# Patient Record
Sex: Male | Born: 1965 | Race: White | Hispanic: No | Marital: Single | Smoking: Never smoker
Health system: Southern US, Community
[De-identification: ages and names within clinical notes are randomized; demographics above are authoritative.]

## PROBLEM LIST (undated history)

## (undated) DIAGNOSIS — K219 Gastro-esophageal reflux disease without esophagitis: Secondary | ICD-10-CM

---

## 2000-08-01 ENCOUNTER — Emergency Department (HOSPITAL_COMMUNITY): Admission: EM | Admit: 2000-08-01 | Discharge: 2000-08-01 | Payer: Self-pay

## 2000-08-12 ENCOUNTER — Emergency Department (HOSPITAL_COMMUNITY): Admission: EM | Admit: 2000-08-12 | Discharge: 2000-08-12 | Payer: Self-pay | Admitting: Emergency Medicine

## 2010-12-28 ENCOUNTER — Ambulatory Visit (INDEPENDENT_AMBULATORY_CARE_PROVIDER_SITE_OTHER): Payer: BC Managed Care – PPO

## 2010-12-28 DIAGNOSIS — J40 Bronchitis, not specified as acute or chronic: Secondary | ICD-10-CM

## 2010-12-28 DIAGNOSIS — R05 Cough: Secondary | ICD-10-CM

## 2012-08-16 ENCOUNTER — Ambulatory Visit (INDEPENDENT_AMBULATORY_CARE_PROVIDER_SITE_OTHER): Payer: BC Managed Care – PPO | Admitting: Emergency Medicine

## 2012-08-16 ENCOUNTER — Ambulatory Visit: Payer: BC Managed Care – PPO

## 2012-08-16 VITALS — BP 122/82 | HR 54 | Temp 97.6°F | Resp 18 | Ht 69.0 in | Wt 182.4 lb

## 2012-08-16 DIAGNOSIS — M25512 Pain in left shoulder: Secondary | ICD-10-CM

## 2012-08-16 DIAGNOSIS — M25519 Pain in unspecified shoulder: Secondary | ICD-10-CM

## 2012-08-16 DIAGNOSIS — M67919 Unspecified disorder of synovium and tendon, unspecified shoulder: Secondary | ICD-10-CM

## 2012-08-16 DIAGNOSIS — M7552 Bursitis of left shoulder: Secondary | ICD-10-CM

## 2012-08-16 MED ORDER — METHYLPREDNISOLONE ACETATE 80 MG/ML IJ SUSP: 80.0000 mg | Freq: Once | INTRAMUSCULAR | Status: DC

## 2012-08-16 MED ORDER — NAPROXEN SODIUM 550 MG PO TABS: 550.0000 mg | ORAL_TABLET | Freq: Two times a day (BID) | ORAL | Status: AC

## 2012-08-16 NOTE — Progress Notes (Addendum)
[facility] [address], [place] [address] [phone]  Date:  08/16/2012   Name:  Paul Singh   DOB:  1965/07/24   MRN:  [id number]  PCP:  No primary provider on file.    Chief Complaint: Shoulder Pain   History of Present Illness:  Paul Singh is a 47 y.o. very pleasant male patient who presents with the following:  Shoulder pain in left shoulder for two weeks.  No history of injury or overuse.  Says that he has pain with laying on the left side.  Full active ROM.  Pain is usually constant but waxes and wanes.  No improvement with over the counter medications or other home remedies. Denies other complaint or health concern today.   There are no active problems to display for this patient.   History reviewed. No pertinent past medical history.  History reviewed. No pertinent past surgical history.  History  Substance Use Topics  . Smoking status: Never Smoker   . Smokeless tobacco: Not on file  . Alcohol Use: No    History reviewed. No pertinent family history.  No Known Allergies  Medication list has been reviewed and updated.  No current outpatient prescriptions on file prior to visit.   No current facility-administered medications on file prior to visit.    Review of Systems:  As per HPI, otherwise negative.    Physical Examination: Filed Vitals:   08/16/12 1150  BP: 122/82  Pulse: 54  Temp: 97.6 F (36.4 C)  Resp: 18   Filed Vitals:   08/16/12 1150  Height: 5\' 9"  (1.753 m)  Weight: 182 lb 6.4 oz (82.736 kg)   Body mass index is 26.92 kg/(m^2). Ideal Body Weight: Weight in (lb) to have BMI = 25: 168.9   GEN: WDWN, NAD, Non-toxic, Alert & Oriented x 3 HEENT: Atraumatic, Normocephalic.  Ears and Nose: No external deformity. EXTR: No clubbing/cyanosis/edema NEURO: Normal gait.  PSYCH: Normally interactive. Conversant. Not depressed or anxious appearing.  Calm demeanor.  SHOULDER:  Left is tender in anterior shoulder  with full passive and active ROM.    Assessment and Plan: Left shoulder bursitis Injected 80 depo and 1 ml marcaine    Signed,  Phillips Odor, MD   [facility] reading (PRIMARY) by  Dr. Dareen Piano.  Negative shoulder.

## 2012-08-16 NOTE — Patient Instructions (Addendum)
Bursitis Bursitis is a swelling and soreness (inflammation) of a fluid-filled sac (bursa) that overlies and protects a joint. It can be caused by injury, overuse of the joint, arthritis or infection. The joints most likely to be affected are the elbows, shoulders, hips and knees. HOME CARE INSTRUCTIONS   Apply ice to the affected area for 15-20 minutes each hour while awake for 2 days. Put the ice in a plastic bag and place a towel between the bag of ice and your skin.  Rest the injured joint as much as possible, but continue to put the joint through a full range of motion, 4 times per day. (The shoulder joint especially becomes rapidly "frozen" if not used.) When the pain lessens, begin normal slow movements and usual activities.  Only take over-the-counter or prescription medicines for pain, discomfort or fever as directed by your caregiver.  Your caregiver may recommend draining the bursa and injecting medicine into the bursa. This may help the healing process.  Follow all instructions for follow-up with your caregiver. This includes any orthopedic referrals, physical therapy and rehabilitation. Any delay in obtaining necessary care could result in a delay or failure of the bursitis to heal and chronic pain. SEEK IMMEDIATE MEDICAL CARE IF:   Your pain increases even during treatment.  You develop an oral temperature above 102 F (38.9 C) and have heat and inflammation over the involved bursa. MAKE SURE YOU:   Understand these instructions.  Will watch your condition.  Will get help right away if you are not doing well or get worse. Document Released: 12/26/1999 Document Revised: 03/22/2011 Document Reviewed: 11/29/2008 Exit[employer] Patient Information 2014 [employer].  

## 2016-09-29 ENCOUNTER — Ambulatory Visit (INDEPENDENT_AMBULATORY_CARE_PROVIDER_SITE_OTHER): Payer: BC Managed Care – PPO | Admitting: Physician Assistant

## 2016-09-29 ENCOUNTER — Encounter: Payer: Self-pay | Admitting: Physician Assistant

## 2016-09-29 VITALS — BP 117/82 | HR 111 | Temp 98.1°F | Resp 17 | Ht 69.0 in | Wt 234.0 lb

## 2016-09-29 DIAGNOSIS — R112 Nausea with vomiting, unspecified: Secondary | ICD-10-CM | POA: Diagnosis not present

## 2016-09-29 DIAGNOSIS — R109 Unspecified abdominal pain: Secondary | ICD-10-CM

## 2016-09-29 LAB — POCT CBC
Granulocyte percent: 67.1 %G (ref 37–80)
HCT, POC: 47 % (ref 43.5–53.7)
Hemoglobin: 16.2 g/dL (ref 14.1–18.1)
Lymph, poc: 1.8 (ref 0.6–3.4)
MCH: 30.6 pg (ref 27–31.2)
MCHC: 34.4 g/dL (ref 31.8–35.4)
MCV: 88.8 fL (ref 80–97)
MID (CBC): 0.5 (ref 0–0.9)
MPV: 6.7 fL (ref 0–99.8)
PLATELET COUNT, POC: 294 10*3/uL (ref 142–424)
POC Granulocyte: 4.7 (ref 2–6.9)
POC LYMPH %: 25.6 % (ref 10–50)
POC MID %: 7.3 %M (ref 0–12)
RBC: 5.3 M/uL (ref 4.69–6.13)
RDW, POC: 12.9 %
WBC: 7 10*3/uL (ref 4.6–10.2)

## 2016-09-29 LAB — POCT URINALYSIS DIP (MANUAL ENTRY)
Glucose, UA: NEGATIVE mg/dL
Leukocytes, UA: NEGATIVE
Nitrite, UA: NEGATIVE
PH UA: 6 (ref 5.0–8.0)
Protein Ur, POC: 30 mg/dL — AB
SPEC GRAV UA: 1.025 (ref 1.010–1.025)
UROBILINOGEN UA: 1 U/dL

## 2016-09-29 LAB — POC MICROSCOPIC URINALYSIS (UMFC)

## 2016-09-29 LAB — GLUCOSE, POCT (MANUAL RESULT ENTRY): POC GLUCOSE: 95 mg/dL (ref 70–99)

## 2016-09-29 MED ORDER — ONDANSETRON 8 MG PO TBDP: 8.0000 mg | ORAL_TABLET | Freq: Three times a day (TID) | ORAL | 0 refills | Status: DC | PRN

## 2016-09-29 NOTE — Patient Instructions (Addendum)
Please use the strainer with urination You can use the ibuprofen for pain.   I would like you to hydrate well with water.     IF you received an x-ray today, you will receive an invoice from [facility]. Please contact [facility] at [phone] with questions or concerns regarding your invoice.   IF you received labwork today, you will receive an invoice from [facility]. Please contact [facility] at [phone] with questions or concerns regarding your invoice.   Our billing staff will not be able to assist you with questions regarding bills from these companies.  You will be contacted with the lab results as soon as they are available. The fastest way to get your results is to activate your My Chart account. Instructions are located on the last page of this paperwork. If you have not heard from [place] regarding the results in 2 weeks, please contact this office.

## 2016-09-29 NOTE — Progress Notes (Signed)
PRIMARY CARE AT [facility] [address], [place] [address] [phone]  Date:  09/29/2016   Name:  Paul Singh   DOB:  01-09-1966   MRN:  [id number]  PCP:  Patient, No Pcp Per    History of Present Illness:  Paul Singh is a 51 y.o. male patient who presents to PCP with  Chief Complaint  Patient presents with  . Abdominal Pain     2 days ago,  Nausea and vomiting with  Pain on left side of his abdomen  Discolored urine 2 days ago.   He did eat last night, and had emesis.   No diarrhea, and more constipated.   Dysuria, hematuria, or frequency.   He is drinking more sodas The lightheadedness occurred prior, but this has resolved. No swimming or foreign travel.   No sick contacts that are known.  There are no active problems to display for this patient.   No past medical history on file.  No past surgical history on file.  Social History  Substance Use Topics  . Smoking status: Never Smoker  . Smokeless tobacco: Never Used  . Alcohol use No    No family history on file.  No Known Allergies  Medication list has been reviewed and updated.  No current outpatient prescriptions on file prior to visit.   Current Facility-Administered Medications on File Prior to Visit  Medication Dose Route Frequency Provider Last Rate Last Dose  . methylPREDNISolone acetate (DEPO-MEDROL) injection 80 mg  80 mg Intramuscular Once Roselee Culver, MD        ROS ROS otherwise unremarkable unless listed above.  Physical Examination: BP 117/82   Pulse (!) 111   Temp 98.1 F (36.7 C) (Oral)   Resp 17   Ht 5' 9"  (1.753 m)   Wt 234 lb (106.1 kg)   SpO2 98%   BMI 34.56 kg/m  Ideal Body Weight: Weight in (lb) to have BMI = 25: 168.9  Physical Exam  Constitutional: He is oriented to person, place, and time. He appears well-developed and well-nourished. No distress.  HENT:  Head: Normocephalic and atraumatic.  Eyes: Pupils are equal, round, and reactive to light.  Conjunctivae and EOM are normal.  Cardiovascular: Normal rate, regular rhythm, normal heart sounds and intact distal pulses.  Exam reveals no friction rub.   No murmur heard. Pulmonary/Chest: Effort normal. No respiratory distress.  Abdominal: Soft. Normal appearance and bowel sounds are normal. There is generalized tenderness and tenderness in the left upper quadrant.  Neurological: He is alert and oriented to person, place, and time.  Skin: Skin is warm and dry. He is not diaphoretic.  Psychiatric: He has a normal mood and affect. His behavior is normal.   Results for orders placed or performed in visit on 09/29/16  CMP14+EGFR  Result Value Ref Range   Glucose 96 65 - 99 mg/dL   BUN 14 6 - 24 mg/dL   Creatinine, Ser 1.27 0.76 - 1.27 mg/dL   GFR calc non Af Amer 65 >59 mL/min/1.73   GFR calc Af Amer 76 >59 mL/min/1.73   BUN/Creatinine Ratio 11 9 - 20   Sodium 143 134 - 144 mmol/L   Potassium 4.0 3.5 - 5.2 mmol/L   Chloride 103 96 - 106 mmol/L   CO2 25 20 - 29 mmol/L   Calcium 9.4 8.7 - 10.2 mg/dL   Total Protein 7.1 6.0 - 8.5 g/dL   Albumin 4.5 3.5 - 5.5 g/dL   Globulin, Total 2.6  1.5 - 4.5 g/dL   Albumin/Globulin Ratio 1.7 1.2 - 2.2   Bilirubin Total 0.7 0.0 - 1.2 mg/dL   Alkaline Phosphatase 76 39 - 117 IU/L   AST 23 0 - 40 IU/L   ALT 19 0 - 44 IU/L  POCT CBC  Result Value Ref Range   WBC 7.0 4.6 - 10.2 K/uL   Lymph, poc 1.8 0.6 - 3.4   POC LYMPH PERCENT 25.6 10 - 50 %L   MID (cbc) 0.5 0 - 0.9   POC MID % 7.3 0 - 12 %M   POC Granulocyte 4.7 2 - 6.9   Granulocyte percent 67.1 37 - 80 %G   RBC 5.30 4.69 - 6.13 M/uL   Hemoglobin 16.2 14.1 - 18.1 g/dL   HCT, POC 47.0 43.5 - 53.7 %   MCV 88.8 80 - 97 fL   MCH, POC 30.6 27 - 31.2 pg   MCHC 34.4 31.8 - 35.4 g/dL   RDW, POC 12.9 %   Platelet Count, POC 294 142 - 424 K/uL   MPV 6.7 0 - 99.8 fL  POCT glucose (manual entry)  Result Value Ref Range   POC Glucose 95 70 - 99 mg/dl  POCT urinalysis dipstick  Result Value Ref  Range   Color, UA yellow yellow   Clarity, UA cloudy (A) clear   Glucose, UA negative negative mg/dL   Bilirubin, UA small (A) negative   Ketones, POC UA trace (5) (A) negative mg/dL   Spec Grav, UA 1.025 1.010 - 1.025   Blood, UA large (A) negative   pH, UA 6.0 5.0 - 8.0   Protein Ur, POC =30 (A) negative mg/dL   Urobilinogen, UA 1.0 0.2 or 1.0 E.U./dL   Nitrite, UA Negative Negative   Leukocytes, UA Negative Negative  POCT Microscopic Urinalysis ([facility])  Result Value Ref Range   WBC,UR,HPF,POC Few (A) None WBC/hpf   RBC,UR,HPF,POC Many (A) None RBC/hpf   Bacteria None None, Too numerous to count   Mucus Present (A) Absent   Epithelial Cells, UR Per Microscopy None None, Too numerous to count cells/hpf      Assessment and Plan: Paul Singh is a 51 y.o. male who is here today  --patient symptoms are improving. --discussed possible imaging same day, or observation / waiting.  He wishes to hold off at this time.  Given strainer. --he does not want a controlled medicine.  Suspect kidney stone.  Advised ibuprofen for pain.  Given zofran for nausea.  He will follow up in 4 days for follow up.  Recommended shaw.   Abdominal pain, unspecified abdominal location - Plan: POCT CBC, POCT glucose (manual entry), POCT urinalysis dipstick, POCT Microscopic Urinalysis ([facility]), ondansetron (ZOFRAN-ODT) 8 MG disintegrating tablet, CMP14+EGFR, Urine Culture  Nausea and vomiting, intractability of vomiting not specified, unspecified vomiting type - Plan: POCT CBC, POCT glucose (manual entry), POCT urinalysis dipstick, POCT Microscopic Urinalysis ([facility]), ondansetron (ZOFRAN-ODT) 8 MG disintegrating tablet, CMP14+EGFR, Urine Culture  Ivar Drape, PA-C [facility] [place] 9/20/201810:48 AM

## 2016-09-30 LAB — CMP14+EGFR
ALT: 19 IU/L (ref 0–44)
AST: 23 IU/L (ref 0–40)
Albumin/Globulin Ratio: 1.7 (ref 1.2–2.2)
Albumin: 4.5 g/dL (ref 3.5–5.5)
Alkaline Phosphatase: 76 IU/L (ref 39–117)
BUN/Creatinine Ratio: 11 (ref 9–20)
BUN: 14 mg/dL (ref 6–24)
Bilirubin Total: 0.7 mg/dL (ref 0.0–1.2)
CALCIUM: 9.4 mg/dL (ref 8.7–10.2)
CO2: 25 mmol/L (ref 20–29)
CREATININE: 1.27 mg/dL (ref 0.76–1.27)
Chloride: 103 mmol/L (ref 96–106)
GFR calc Af Amer: 76 mL/min/{1.73_m2} (ref >59)
GFR, EST NON AFRICAN AMERICAN: 65 mL/min/{1.73_m2} (ref >59)
GLOBULIN, TOTAL: 2.6 g/dL (ref 1.5–4.5)
GLUCOSE: 96 mg/dL (ref 65–99)
Potassium: 4 mmol/L (ref 3.5–5.2)
Sodium: 143 mmol/L (ref 134–144)
Total Protein: 7.1 g/dL (ref 6.0–8.5)

## 2016-10-01 LAB — URINE CULTURE

## 2016-10-14 ENCOUNTER — Encounter: Payer: Self-pay | Admitting: *Deleted

## 2016-10-26 ENCOUNTER — Telehealth: Payer: Self-pay | Admitting: Physician Assistant

## 2016-10-26 NOTE — Telephone Encounter (Signed)
Pt called 10/26/16 in response to the letter we sent him regarding his lab results. I tried John in 102 and let the pt. Know that I was putting in a note in his chart for someone to call him back. I did update his cell phone (wrong number) and his work extension is [phone].  He said that he would be unavailable for most of the day today but would appreciate a call back.   I sent the pt. A link for MyChart and advised him that he would be able to see results on there.   Please advise

## 2016-11-01 NOTE — Telephone Encounter (Signed)
LVM and advised of lab results

## 2017-04-20 ENCOUNTER — Encounter: Payer: Self-pay | Admitting: Physician Assistant

## 2019-05-30 ENCOUNTER — Emergency Department (HOSPITAL_BASED_OUTPATIENT_CLINIC_OR_DEPARTMENT_OTHER): Payer: BC Managed Care – PPO

## 2019-05-30 ENCOUNTER — Encounter (HOSPITAL_BASED_OUTPATIENT_CLINIC_OR_DEPARTMENT_OTHER): Payer: Self-pay | Admitting: *Deleted

## 2019-05-30 ENCOUNTER — Other Ambulatory Visit: Payer: Self-pay

## 2019-05-30 ENCOUNTER — Emergency Department (HOSPITAL_BASED_OUTPATIENT_CLINIC_OR_DEPARTMENT_OTHER)
Admission: EM | Admit: 2019-05-30 | Discharge: 2019-05-31 | Disposition: A | Discharge status: Home / Self Care | Payer: BC Managed Care – PPO | Attending: Emergency Medicine | Admitting: Emergency Medicine

## 2019-05-30 DIAGNOSIS — H9319 Tinnitus, unspecified ear: Secondary | ICD-10-CM | POA: Insufficient documentation

## 2019-05-30 DIAGNOSIS — R42 Dizziness and giddiness: Secondary | ICD-10-CM

## 2019-05-30 HISTORY — DX: Gastro-esophageal reflux disease without esophagitis: K21.9

## 2019-05-30 LAB — BASIC METABOLIC PANEL
Anion gap: 9 (ref 5–15)
BUN: 18 mg/dL (ref 6–20)
CO2: 26 mmol/L (ref 22–32)
Calcium: 8.7 mg/dL — ABNORMAL LOW (ref 8.9–10.3)
Chloride: 102 mmol/L (ref 98–111)
Creatinine, Ser: 1.16 mg/dL (ref 0.61–1.24)
GFR calc Af Amer: >60 mL/min (ref >60)
GFR calc non Af Amer: >60 mL/min (ref >60)
Glucose, Bld: 101 mg/dL — ABNORMAL HIGH (ref 70–99)
Potassium: 3.6 mmol/L (ref 3.5–5.1)
Sodium: 137 mmol/L (ref 135–145)

## 2019-05-30 LAB — CBC WITH DIFFERENTIAL/PLATELET
Abs Immature Granulocytes: 0.03 10*3/uL (ref 0.00–0.07)
Basophils Absolute: 0 10*3/uL (ref 0.0–0.1)
Basophils Relative: 1 %
Eosinophils Absolute: 0.1 10*3/uL (ref 0.0–0.5)
Eosinophils Relative: 1 %
HCT: 47.8 % (ref 39.0–52.0)
Hemoglobin: 16 g/dL (ref 13.0–17.0)
Immature Granulocytes: 0 %
Lymphocytes Relative: 28 %
Lymphs Abs: 2 10*3/uL (ref 0.7–4.0)
MCH: 29.9 pg (ref 26.0–34.0)
MCHC: 33.5 g/dL (ref 30.0–36.0)
MCV: 89.2 fL (ref 80.0–100.0)
Monocytes Absolute: 0.7 10*3/uL (ref 0.1–1.0)
Monocytes Relative: 9 %
Neutro Abs: 4.4 10*3/uL (ref 1.7–7.7)
Neutrophils Relative %: 61 %
Platelets: 262 10*3/uL (ref 150–400)
RBC: 5.36 MIL/uL (ref 4.22–5.81)
RDW: 12.5 % (ref 11.5–15.5)
WBC: 7.2 10*3/uL (ref 4.0–10.5)
nRBC: 0 % (ref 0.0–0.2)

## 2019-05-30 MED ORDER — MECLIZINE HCL 25 MG PO TABS: 25.0000 mg | ORAL_TABLET | Freq: Three times a day (TID) | ORAL | 0 refills | Status: AC | PRN

## 2019-05-30 MED ORDER — IOHEXOL 350 MG/ML SOLN
100.0000 mL | Freq: Once | INTRAVENOUS | Status: AC
  Administered 2019-05-30: 100 mL via INTRAVENOUS

## 2019-05-30 NOTE — Discharge Instructions (Signed)
You are seen in the emergency department for evaluation of room spinning.  You had blood work and a CAT scan of your head and neck that did not show any serious findings.  This is likely vertigo and is benign although uncomfortable.  We are prescribing meclizine which may help improve your symptoms.  We also put a referral into physical therapy as they have some maneuvers that sometimes also help with your symptoms.  Please return to the emergency department if any worsening or concerning symptoms

## 2019-05-30 NOTE — ED Triage Notes (Signed)
Pt c/o sudden onset of dizziness x 3 days ago

## 2019-05-30 NOTE — ED Provider Notes (Signed)
[facility] EMERGENCY DEPARTMENT Provider Note   CSN: [id number] Arrival date & time: 05/30/19  1959     History Chief Complaint  Patient presents with  . Dizziness    Paul Singh is a 54 y.o. male.  He has no significant past medical history.  He is complaining of sudden onset of dizziness room spinning that started 3 days ago.  He said he was very symptomatic at that time and nauseous.  Had some ear ringing.  Mild headache.  The symptoms have improved over time.  He just wants to make sure nothing more serious is happened.  No blurry vision double vision numbness weakness.  Having a little bit of balance problem but better than 3 days ago.  No recent head injuries.  The history is provided by the patient.  Dizziness Quality:  Room spinning Severity:  Moderate Onset quality:  Sudden Duration:  3 days Timing:  Intermittent Progression:  Improving Chronicity:  New Context: head movement   Relieved by:  Being still Worsened by:  Movement Associated symptoms: headaches, nausea and tinnitus   Associated symptoms: no chest pain, no shortness of breath, no syncope, no vision changes and no weakness   Risk factors: no hx of vertigo        Past Medical History:  Diagnosis Date  . GERD (gastroesophageal reflux disease)     There are no problems to display for this patient.   History reviewed. No pertinent surgical history.     No family history on file.  Social History   Tobacco Use  . Smoking status: Never Smoker  . Smokeless tobacco: Never Used  Substance Use Topics  . Alcohol use: No  . Drug use: No    Home Medications Prior to Admission medications   Medication Sig Start Date End Date Taking? Authorizing Provider  ondansetron (ZOFRAN-ODT) 8 MG disintegrating tablet Take 1 tablet (8 mg total) by mouth every 8 (eight) hours as needed for nausea. 09/29/16   Trena Platt D, PA    Allergies    Patient has no known allergies.  Review of  Systems   Review of Systems  Constitutional: Negative for fever.  HENT: Positive for tinnitus. Negative for sore throat.   Eyes: Negative for visual disturbance.  Respiratory: Negative for shortness of breath.   Cardiovascular: Negative for chest pain and syncope.  Gastrointestinal: Positive for nausea. Negative for abdominal pain.  Genitourinary: Negative for dysuria.  Musculoskeletal: Negative for neck pain.  Skin: Negative for rash.  Neurological: Positive for dizziness and headaches. Negative for weakness.    Physical Exam Updated Vital Signs BP (!) 133/93   Pulse 78   Temp 99.5 F (37.5 C)   Resp 18   Ht 5\' 9"  (1.753 m)   Wt 90.7 kg   SpO2 100%   BMI 29.53 kg/m   Physical Exam Vitals and nursing note reviewed.  Constitutional:      Appearance: He is well-developed.  HENT:     Head: Normocephalic and atraumatic.  Eyes:     Conjunctiva/sclera: Conjunctivae normal.     Comments: Has some horizontal nystagmus  Cardiovascular:     Rate and Rhythm: Normal rate and regular rhythm.     Pulses: Normal pulses.     Heart sounds: No murmur.  Pulmonary:     Effort: Pulmonary effort is normal. No respiratory distress.     Breath sounds: Normal breath sounds.  Abdominal:     Palpations: Abdomen is soft.  Tenderness: There is no abdominal tenderness.  Musculoskeletal:        General: No deformity or signs of injury. Normal range of motion.     Cervical back: Neck supple.  Skin:    General: Skin is warm and dry.     Capillary Refill: Capillary refill takes less than 2 seconds.  Neurological:     General: No focal deficit present.     Mental Status: He is alert and oriented to person, place, and time.     Cranial Nerves: No cranial nerve deficit.     Sensory: No sensory deficit.     Motor: No weakness.     Coordination: Coordination normal.     Gait: Gait normal.     ED Results / Procedures / Treatments   Labs (all labs ordered are listed, but only abnormal  results are displayed) Labs Reviewed  BASIC METABOLIC PANEL - Abnormal; Notable for the following components:      Result Value   Glucose, Bld 101 (*)    Calcium 8.7 (*)    All other components within normal limits  CBC WITH DIFFERENTIAL/PLATELET    EKG None  Radiology CT Angio Head W/Cm &/Or Wo Cm  Result Date: 05/30/2019 CLINICAL DATA:  54 year old male with sudden onset dizziness 3 days ago. EXAM: CT ANGIOGRAPHY HEAD AND NECK TECHNIQUE: Multidetector CT imaging of the head and neck was performed using the standard protocol during bolus administration of intravenous contrast. Multiplanar CT image reconstructions and MIPs were obtained to evaluate the vascular anatomy. Carotid stenosis measurements (when applicable) are obtained utilizing NASCET criteria, using the distal internal carotid diameter as the denominator. CONTRAST:  OMNIPAQUE IOHEXOL 350 MG/ML SOLN COMPARISON:  None. FINDINGS: CT HEAD Brain: Normal cerebral volume. No midline shift, ventriculomegaly, mass effect, evidence of mass lesion, intracranial hemorrhage or evidence of cortically based acute infarction. Gray-white matter differentiation is within normal limits throughout the brain. Calvarium and skull base: Negative. Paranasal sinuses: Visualized paranasal sinuses and mastoids are clear. Orbits: Visualized orbits and scalp soft tissues are within normal limits. CTA NECK Skeleton: Carious posterior dentition on both sides. 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 No acute osseous abnormality identified. Upper chest: Negative. Other neck: Negative. Aortic arch: 3 vessel arch configuration with no arch atherosclerosis. Right carotid system: Streak artifact from dense right subclavian vein contrast obscures some of the proximal right CCA. But otherwise negative right CCA, with negative right carotid bifurcation and cervical right ICA. Left carotid  system: Negative. Vertebral arteries: Proximal right subclavian artery and right vertebral artery origin appear normal. Lower paravertebral venous contrast reflux mildly degrades detail of the right V1 segment, but otherwise the right vertebral appears patent and normal to the skull base. Proximal left subclavian artery and left vertebral artery origin are normal. Fairly codominant left vertebral artery is patent and normal to the skull base. CTA HEAD Posterior circulation: Normal distal vertebral arteries, PICA origins and vertebrobasilar junction. The proximal basilar appears mildly fenestrated (normal variant). The basilar artery is diminutive distally but patent with no focal stenosis. There is a fetal right PCA origin. SCA origins are patent. Small left posterior communicating artery is present. Bilateral PCA branches are normal. Anterior circulation: Both ICA siphons are patent without stenosis. There is minimal siphon calcified plaque. Normal ophthalmic and posterior communicating artery origins. Patent and normal carotid termini, MCA and ACA origins. Anterior communicating artery and bilateral ACA branches are within normal limits. Left MCA  M1 segment and bifurcation are patent without stenosis. Right MCA M1 segment and bifurcation are patent without stenosis. Bilateral MCA branches are within normal limits. Venous sinuses: Patent. Anatomic variants: Fetal right PCA origin. Review of the MIP images confirms the above findings IMPRESSION: 1. Normal arterial findings on CTA Head and Neck. 2. Normal CT appearance of the brain. 3. 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 4. Carious posterior dentition. Electronically Signed   By: Odessa Fleming M.D.   On: 05/30/2019 23:36   CT Angio Neck W and/or Wo Contrast  Result Date: 05/30/2019 CLINICAL DATA:  54 year old male with sudden onset dizziness 3 days ago. EXAM: CT ANGIOGRAPHY HEAD AND NECK TECHNIQUE:  Multidetector CT imaging of the head and neck was performed using the standard protocol during bolus administration of intravenous contrast. Multiplanar CT image reconstructions and MIPs were obtained to evaluate the vascular anatomy. Carotid stenosis measurements (when applicable) are obtained utilizing NASCET criteria, using the distal internal carotid diameter as the denominator. CONTRAST:  OMNIPAQUE IOHEXOL 350 MG/ML SOLN COMPARISON:  None. FINDINGS: CT HEAD Brain: Normal cerebral volume. No midline shift, ventriculomegaly, mass effect, evidence of mass lesion, intracranial hemorrhage or evidence of cortically based acute infarction. Gray-white matter differentiation is within normal limits throughout the brain. Calvarium and skull base: Negative. Paranasal sinuses: Visualized paranasal sinuses and mastoids are clear. Orbits: Visualized orbits and scalp soft tissues are within normal limits. CTA NECK Skeleton: Carious posterior dentition on both sides. 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 No acute osseous abnormality identified. Upper chest: Negative. Other neck: Negative. Aortic arch: 3 vessel arch configuration with no arch atherosclerosis. Right carotid system: Streak artifact from dense right subclavian vein contrast obscures some of the proximal right CCA. But otherwise negative right CCA, with negative right carotid bifurcation and cervical right ICA. Left carotid system: Negative. Vertebral arteries: Proximal right subclavian artery and right vertebral artery origin appear normal. Lower paravertebral venous contrast reflux mildly degrades detail of the right V1 segment, but otherwise the right vertebral appears patent and normal to the skull base. Proximal left subclavian artery and left vertebral artery origin are normal. Fairly codominant left vertebral artery is patent and normal to the skull base. CTA HEAD Posterior  circulation: Normal distal vertebral arteries, PICA origins and vertebrobasilar junction. The proximal basilar appears mildly fenestrated (normal variant). The basilar artery is diminutive distally but patent with no focal stenosis. There is a fetal right PCA origin. SCA origins are patent. Small left posterior communicating artery is present. Bilateral PCA branches are normal. Anterior circulation: Both ICA siphons are patent without stenosis. There is minimal siphon calcified plaque. Normal ophthalmic and posterior communicating artery origins. Patent and normal carotid termini, MCA and ACA origins. Anterior communicating artery and bilateral ACA branches are within normal limits. Left MCA M1 segment and bifurcation are patent without stenosis. Right MCA M1 segment and bifurcation are patent without stenosis. Bilateral MCA branches are within normal limits. Venous sinuses: Patent. Anatomic variants: Fetal right PCA origin. Review of the MIP images confirms the above findings IMPRESSION: 1. Normal arterial findings on CTA Head and Neck. 2. Normal CT appearance of the brain. 3. 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 4. Carious posterior dentition. Electronically Signed   By: Odessa Fleming M.D.   On: 05/30/2019 23:36    Procedures Procedures (including critical care time)  Medications Ordered in ED  Medications - No data to display  ED Course  I have reviewed the triage vital signs and the nursing notes.  Pertinent labs & imaging results that were available during my care of the patient were reviewed by me and considered in my medical decision making (see chart for details).    MDM Rules/Calculators/A&P                     This patient complains of room spinning tinnitus nausea.; this involves an extensive number of treatment Options and is a complaint that carries with it a high risk of complications and Morbidity. The differential  includes vertigo, vertebral dissection, stroke  I ordered, reviewed and interpreted labs, which included CBC normal no evidence of anemia, chemistries normal. I ordered imaging studies which included CT angio head and neck and I independently    visualized and interpreted imaging which showed no gross findings  After the interventions stated above, I reevaluated the patient and found the patient to be minimally symptomatic.  I signed out to Dr. Florina Ou with plan for following up on radiology interpretation of the CAT scan.  If there are no acute findings discharged.  I provided him with a prescription for meclizine and a referral to physical therapy for vestibular PT.  Return instructions discussed.   Final Clinical Impression(s) / ED Diagnoses Final diagnoses:  Vertigo    Rx / DC Orders ED Discharge Orders         Ordered    meclizine (ANTIVERT) 25 MG tablet  3 times daily PRN     05/30/19 2315    Ambulatory referral to Physical Therapy     05/30/19 2315           Hayden Rasmussen, MD 05/31/19 [phone]

## 2019-07-06 ENCOUNTER — Telehealth (HOSPITAL_COMMUNITY): Payer: Self-pay | Admitting: Emergency Medicine

## 2019-07-06 NOTE — Telephone Encounter (Signed)
Physical therapy asking for another referral.

## 2021-10-26 IMAGING — CT CT ANGIO NECK
1 of 11 series · 5 of 33 positions shown · IV contrast (omnipaque)
Comparison: None.

CLINICAL DATA: 54-year-old male with sudden onset dizziness 3 days
ago.

EXAM:
CT ANGIOGRAPHY HEAD AND NECK
TECHNIQUE: Multidetector CT imaging of the head and neck was performed using
the standard protocol during bolus administration of intravenous
contrast. Multiplanar CT image reconstructions and MIPs were
obtained to evaluate the vascular anatomy. Carotid stenosis
measurements (when applicable) are obtained utilizing NASCET
criteria, using the distal internal carotid diameter as the
denominator.
CONTRAST:  100mL OMNIPAQUE IOHEXOL 350 MG/ML SOLN

[Series 11: axial thin · axial · 0.43mm/px · z∈[+984,+1218]mm · 5 of 352 slices shown]
[im 59/352  soft-tissue]
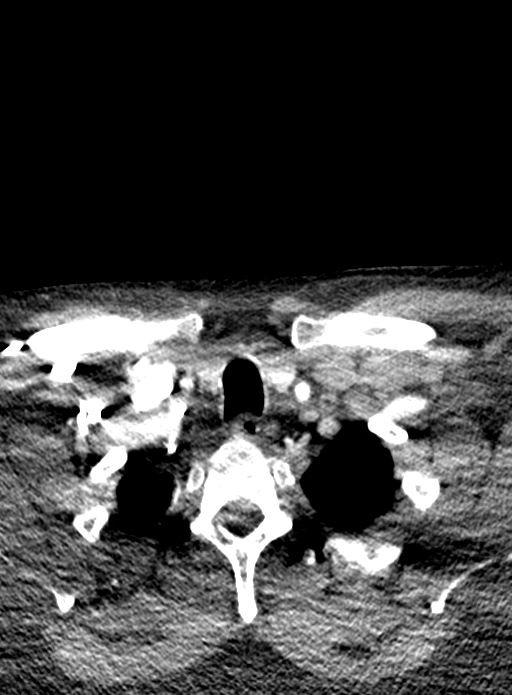
[im 118/352  bone]
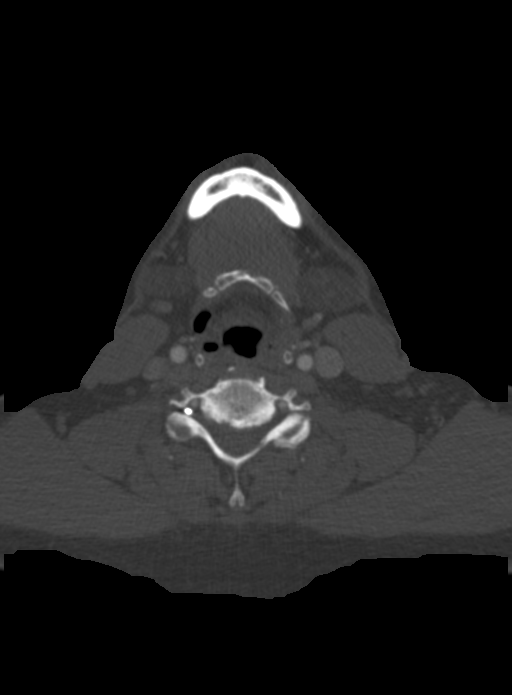
[im 176/352  soft-tissue]
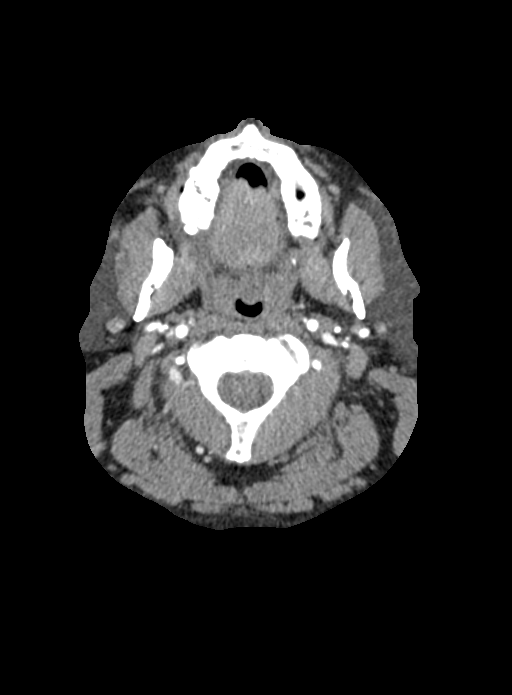
[im 235/352  bone]
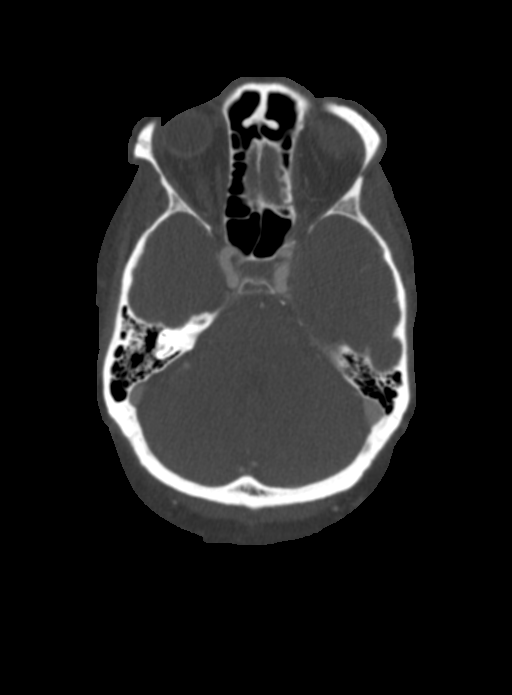
[im 293/352  soft-tissue]
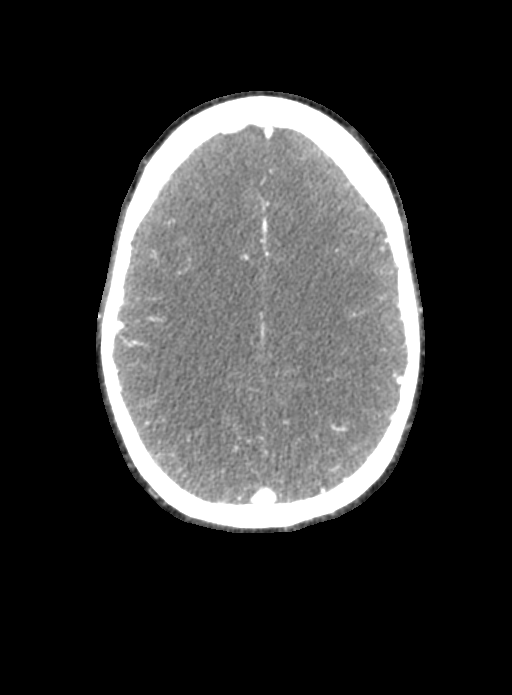

[5 of 33 positions shown; findings below may reference images not displayed]

FINDINGS: CT HEAD

Brain: Normal cerebral volume. No midline shift, ventriculomegaly,
mass effect, evidence of mass lesion, intracranial hemorrhage or
evidence of cortically based acute infarction. Gray-white matter
differentiation is within normal limits throughout the brain.

Calvarium and skull base: Negative.

Paranasal sinuses: Visualized paranasal sinuses and mastoids are
clear.

Orbits: Visualized orbits and scalp soft tissues are within normal
limits.

CTA NECK

Skeleton: Carious posterior dentition on both sides. Lower cervical
spine Ossification of the posterior longitudinal ligament (OPLL).
With suspected mild spinal stenosis. No acute osseous abnormality
identified.

Upper chest: Negative.

Other neck: Negative.

Aortic arch: 3 vessel arch configuration with no arch
atherosclerosis.

Right carotid system: Streak artifact from dense right subclavian
vein contrast obscures some of the proximal right CCA. But otherwise
negative right CCA, with negative right carotid bifurcation and
cervical right ICA.

Left carotid system: Negative.

Vertebral arteries:
Proximal right subclavian artery and right vertebral artery origin
appear normal. Lower paravertebral venous contrast reflux mildly
degrades detail of the right V1 segment, but otherwise the right
vertebral appears patent and normal to the skull base.

Proximal left subclavian artery and left vertebral artery origin are
normal. Fairly codominant left vertebral artery is patent and normal
to the skull base.

CTA HEAD

Posterior circulation: Normal distal vertebral arteries, PICA
origins and vertebrobasilar junction. The proximal basilar appears
mildly fenestrated (normal variant). The basilar artery is
diminutive distally but patent with no focal stenosis. There is a
fetal right PCA origin. SCA origins are patent. Small left posterior
communicating artery is present. Bilateral PCA branches are normal.

Anterior circulation: Both ICA siphons are patent without stenosis.
There is minimal siphon calcified plaque. Normal ophthalmic and
posterior communicating artery origins. Patent and normal carotid
termini, MCA and ACA origins. Anterior communicating artery and
bilateral ACA branches are within normal limits. Left MCA M1 segment
and bifurcation are patent without stenosis. Right MCA M1 segment
and bifurcation are patent without stenosis. Bilateral MCA branches
are within normal limits.

Venous sinuses: Patent.

Anatomic variants: Fetal right PCA origin.

Review of the MIP images confirms the above findings
IMPRESSION: 1. Normal arterial findings on CTA Head and Neck.
2. Normal CT appearance of the brain.
3. Lower cervical spine ossified posterior longitudinal ligament
(OPLL) with probable mild spinal stenosis.
4. Carious posterior dentition.
